# Patient Record
Sex: Female | Born: 1955 | Race: White | Hispanic: No | Marital: Married | Smoking: Former smoker
Health system: Southern US, Community
[De-identification: ages and names within clinical notes are randomized; demographics above are authoritative.]

## PROBLEM LIST (undated history)

## (undated) DIAGNOSIS — R079 Chest pain, unspecified: Secondary | ICD-10-CM

## (undated) DIAGNOSIS — L659 Nonscarring hair loss, unspecified: Secondary | ICD-10-CM

## (undated) DIAGNOSIS — C801 Malignant (primary) neoplasm, unspecified: Secondary | ICD-10-CM

## (undated) DIAGNOSIS — R030 Elevated blood-pressure reading, without diagnosis of hypertension: Secondary | ICD-10-CM

## (undated) DIAGNOSIS — N951 Menopausal and female climacteric states: Secondary | ICD-10-CM

## (undated) DIAGNOSIS — D219 Benign neoplasm of connective and other soft tissue, unspecified: Secondary | ICD-10-CM

## (undated) HISTORY — DX: Benign neoplasm of connective and other soft tissue, unspecified: D21.9

## (undated) HISTORY — DX: Elevated blood-pressure reading, without diagnosis of hypertension: R03.0

## (undated) HISTORY — PX: BREAST SURGERY: SHX581

## (undated) HISTORY — DX: Chest pain, unspecified: R07.9

## (undated) HISTORY — DX: Malignant (primary) neoplasm, unspecified: C80.1

## (undated) HISTORY — PX: TUBAL LIGATION: SHX77

## (undated) HISTORY — PX: COSMETIC SURGERY: SHX468

---
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
## 1997-12-12 ENCOUNTER — Emergency Department (HOSPITAL_COMMUNITY): Admission: EM | Admit: 1997-12-12 | Discharge: 1997-12-12 | Payer: Self-pay

## 1998-10-27 ENCOUNTER — Ambulatory Visit (HOSPITAL_BASED_OUTPATIENT_CLINIC_OR_DEPARTMENT_OTHER): Admission: RE | Admit: 1998-10-27 | Discharge: 1998-10-27 | Payer: Self-pay | Admitting: Plastic Surgery

## 2001-09-30 ENCOUNTER — Encounter: Payer: Self-pay | Admitting: *Deleted

## 2001-09-30 ENCOUNTER — Emergency Department (HOSPITAL_COMMUNITY): Admission: EM | Admit: 2001-09-30 | Discharge: 2001-09-30 | Payer: Self-pay | Admitting: Emergency Medicine

## 2001-09-30 ENCOUNTER — Encounter: Payer: Self-pay | Admitting: Emergency Medicine

## 2005-08-25 ENCOUNTER — Other Ambulatory Visit: Admission: RE | Admit: 2005-08-25 | Discharge: 2005-08-25 | Payer: Self-pay | Admitting: Obstetrics and Gynecology

## 2006-02-28 ENCOUNTER — Encounter: Admission: RE | Admit: 2006-02-28 | Discharge: 2006-02-28 | Payer: Self-pay | Admitting: Obstetrics and Gynecology

## 2008-05-26 ENCOUNTER — Encounter: Payer: Self-pay | Admitting: Obstetrics and Gynecology

## 2008-05-26 ENCOUNTER — Other Ambulatory Visit: Admission: RE | Admit: 2008-05-26 | Discharge: 2008-05-26 | Payer: Self-pay | Admitting: Obstetrics and Gynecology

## 2008-05-26 ENCOUNTER — Ambulatory Visit: Payer: Self-pay | Admitting: Obstetrics and Gynecology

## 2009-11-04 ENCOUNTER — Ambulatory Visit: Payer: Self-pay | Admitting: Obstetrics and Gynecology

## 2009-11-04 ENCOUNTER — Other Ambulatory Visit: Admission: RE | Admit: 2009-11-04 | Discharge: 2009-11-04 | Payer: Self-pay | Admitting: Obstetrics and Gynecology

## 2009-11-24 ENCOUNTER — Ambulatory Visit: Payer: Self-pay | Admitting: Obstetrics and Gynecology

## 2010-02-14 ENCOUNTER — Encounter: Payer: Self-pay | Admitting: Obstetrics and Gynecology

## 2010-11-29 ENCOUNTER — Encounter (INDEPENDENT_AMBULATORY_CARE_PROVIDER_SITE_OTHER): Payer: Self-pay | Admitting: Ophthalmology

## 2010-11-29 DIAGNOSIS — H3530 Unspecified macular degeneration: Secondary | ICD-10-CM

## 2010-11-29 DIAGNOSIS — H43819 Vitreous degeneration, unspecified eye: Secondary | ICD-10-CM

## 2010-11-29 DIAGNOSIS — H348392 Tributary (branch) retinal vein occlusion, unspecified eye, stable: Secondary | ICD-10-CM

## 2011-08-25 ENCOUNTER — Other Ambulatory Visit: Payer: Self-pay | Admitting: Obstetrics and Gynecology

## 2011-08-30 ENCOUNTER — Other Ambulatory Visit: Payer: Self-pay | Admitting: Obstetrics and Gynecology

## 2011-08-30 NOTE — Telephone Encounter (Signed)
As far as I can tell patient has not been here in over one year. Also she needs to be on both estrogen and progesterone. Why was there just refills for estrogen?

## 2011-08-30 NOTE — Telephone Encounter (Signed)
Debarah Crape is going to try to get patient scheduled for CE and then I will check with Dr. Reece Agar regarding refill okay.  Last CE 10/2009 and last office visit 11/2009.

## 2011-08-30 NOTE — Telephone Encounter (Signed)
Rhonda Ray spoke with the patient.  She said she has no insurance and cannot afford appointment.  [name] told her she needs to come for CE to continue getting her prescription. [name] scheduled her for Sept 18 for [facility] but not sure she will keep it.  Her last CE was 11/04/09.  Please advise above regarding refills.  (Hardcopy chart will be in your office for your review.)

## 2011-08-31 ENCOUNTER — Other Ambulatory Visit: Payer: Self-pay | Admitting: Obstetrics and Gynecology

## 2011-08-31 MED ORDER — MEDROXYPROGESTERONE ACETATE 5 MG PO TABS: ORAL_TABLET | ORAL | Status: DC

## 2011-08-31 MED ORDER — ESTRADIOL 0.5 MG PO TABS: 0.5000 mg | ORAL_TABLET | Freq: Every day | ORAL | Status: DC

## 2011-08-31 NOTE — Telephone Encounter (Signed)
I spoke with patient today.  Patient said she has not been on estrogen or progesterone for quite some time. When refills ran out she quit taking it.  Has not been able to afford to come in.  Out of work and no insurance.  Had been fine without it for some time.  Recently began with hotflashes and nightsweats.  I explained the importance of her taking progesterone to protect her uterus and she understood and had always done that before.   She has to see the dentist in August and with that bill cannot afford to come here until September.  Patient states no changes in her health. No new diagnoses.  Has "gained a few pounds" but other than that all is well.  I told her I would talk with you and see if you would want to prescribe for her until she can come in in September.

## 2011-10-03 ENCOUNTER — Encounter: Payer: Self-pay | Admitting: Gynecology

## 2011-10-12 ENCOUNTER — Encounter: Payer: Self-pay | Admitting: Obstetrics and Gynecology

## 2011-10-12 ENCOUNTER — Ambulatory Visit (INDEPENDENT_AMBULATORY_CARE_PROVIDER_SITE_OTHER): Payer: Self-pay | Admitting: Obstetrics and Gynecology

## 2011-10-12 VITALS — BP 118/78 | Ht 60.0 in | Wt 143.0 lb

## 2011-10-12 DIAGNOSIS — Z01419 Encounter for gynecological examination (general) (routine) without abnormal findings: Secondary | ICD-10-CM

## 2011-10-12 MED ORDER — MEDROXYPROGESTERONE ACETATE 5 MG PO TABS: ORAL_TABLET | ORAL | Status: DC

## 2011-10-12 MED ORDER — ESTRADIOL 0.5 MG PO TABS: 0.5000 mg | ORAL_TABLET | Freq: Every day | ORAL | Status: DC

## 2011-10-12 NOTE — Progress Notes (Signed)
Patient came to see me today for her annual GYN exam. When I last saw her in 2011 she had initiated menopause and was symptomatic. We started her on HRT with good results. When she ran out of medication she stopped it. We just called her in  Medication and she already feels better. She does her progesterone cyclically but does not have withdrawal. She is overdue for mammogram. She is currently uninsured. She has never had a bone density. She has never had an abnormal Pap smear. Her last Pap smear was 2011. She is having no vaginal bleeding. She is having no pelvic pain. She has a history of small fibroids.  Physical examination:Rhonda Ray present. HEENT within normal limits. Neck: Thyroid not large. No masses. Supraclavicular nodes: not enlarged. Breasts: Examined in both sitting and lying  position. No skin changes and no masses. Abdomen: Soft no guarding rebound or masses or hernia. Pelvic: External: Within normal limits. BUS: Within normal limits. Vaginal:within normal limits. Good estrogen effect. No evidence of cystocele rectocele or enterocele. Cervix: clean. Uterus: top normal size and shape. Adnexa: No masses. Rectovaginal exam: Confirmatory and negative. Extremities: Within normal limits.  Assessment: Menopausal symptoms. Asymptomatic small fibroids.  Plan: Continue estradiol half a milligram daily. Continue medroxyprogesterone 5 mg day 1 the 12. Information on mammographic scholarships  Given. The new Pap smear guidelines were discussed with the patient. No pap done.

## 2011-10-12 NOTE — Patient Instructions (Signed)
Schedule mammogram.

## 2011-10-13 LAB — URINALYSIS W MICROSCOPIC + REFLEX CULTURE
Bacteria, UA: NONE SEEN
Bilirubin Urine: NEGATIVE
Hgb urine dipstick: NEGATIVE
Ketones, ur: NEGATIVE mg/dL
Protein, ur: NEGATIVE mg/dL
Squamous Epithelial / LPF: NONE SEEN
Urobilinogen, UA: 0.2 mg/dL (ref 0.0–1.0)

## 2011-12-15 ENCOUNTER — Other Ambulatory Visit: Payer: Self-pay

## 2013-11-25 ENCOUNTER — Encounter: Payer: Self-pay | Admitting: Obstetrics and Gynecology

## 2015-09-29 ENCOUNTER — Encounter: Payer: Self-pay | Admitting: Gynecology

## 2015-09-29 ENCOUNTER — Ambulatory Visit (INDEPENDENT_AMBULATORY_CARE_PROVIDER_SITE_OTHER): Payer: Self-pay | Admitting: Gynecology

## 2015-09-29 VITALS — BP 140/88 | Ht 59.75 in | Wt 155.0 lb

## 2015-09-29 DIAGNOSIS — L659 Nonscarring hair loss, unspecified: Secondary | ICD-10-CM

## 2015-09-29 DIAGNOSIS — N951 Menopausal and female climacteric states: Secondary | ICD-10-CM

## 2015-09-29 DIAGNOSIS — Z01419 Encounter for gynecological examination (general) (routine) without abnormal findings: Secondary | ICD-10-CM

## 2015-09-29 HISTORY — DX: Nonscarring hair loss, unspecified: L65.9

## 2015-09-29 HISTORY — DX: Menopausal and female climacteric states: N95.1

## 2015-09-29 MED ORDER — ESTRADIOL 0.5 MG PO TABS: 0.5000 mg | ORAL_TABLET | Freq: Every day | ORAL | 11 refills | Status: DC

## 2015-09-29 MED ORDER — MEDROXYPROGESTERONE ACETATE 10 MG PO TABS: 10.0000 mg | ORAL_TABLET | Freq: Every day | ORAL | 4 refills | Status: DC

## 2015-09-29 NOTE — Patient Instructions (Signed)
Medroxyprogesterone tablets  What is this medicine?  MEDROXYPROGESTERONE (me DROX ee proe JES te rone) is a hormone in a class called progestins. It is commonly used to prevent the uterine lining from overgrowth in women taking an estrogen after menopause. It is also used to treat irregular menstrual bleeding or a lack of menstrual bleeding in women.  This medicine may be used for other purposes; ask your health care provider or pharmacist if you have questions.  What should I tell my health care provider before I take this medicine?  They need to know if you have any of these conditions:  -blood vessel disease or a history of a blood clot in the lungs or legs  -breast, cervical or vaginal cancer  -heart disease  -kidney disease  -liver disease  -migraine  -recent miscarriage or abortion  -mental depression  -migraine  -seizures (convulsions)  -stroke  -vaginal bleeding that has not been evaluated  -an unusual or allergic reaction to medroxyprogesterone, other medicines, foods, dyes, or preservatives  -pregnant or trying to get pregnant  -breast-feeding  How should I use this medicine?  Take this medicine by mouth with a glass of water. Follow the directions on the prescription label. Take your doses at regular intervals. Do not take your medicine more often than directed.  Talk to your pediatrician regarding the use of this medicine in children. Special care may be needed. While this drug may be prescribed for children as young as 13 years for selected conditions, precautions do apply.  Overdosage: If you think you have taken too much of this medicine contact a poison control center or emergency room at once.  NOTE: This medicine is only for you. Do not share this medicine with others.  What if I miss a dose?  If you miss a dose, take it as soon as you can. If it is almost time for your next dose, take only that dose. Do not take double or extra doses.  What may interact with this medicine?  -barbiturate medicines  for inducing sleep or treating seizures (convulsions)  -bosentan  -carbamazepine  -phenytoin  -rifampin  -St. John's Wort  This list may not describe all possible interactions. Give your health care provider a list of all the medicines, herbs, non-prescription drugs, or dietary supplements you use. Also tell them if you smoke, drink alcohol, or use illegal drugs. Some items may interact with your medicine.  What should I watch for while using this medicine?  Visit your health care professional for regular checks on your progress. You will need a regular breast and pelvic exam.  If you have any reason to think you are pregnant, stop taking this medicine at once and contact your doctor or health care professional.  What side effects may I notice from receiving this medicine?  Side effects that you should report to your doctor or health care professional as soon as possible:  -breast tenderness or discharge  -changes in mood or emotions, such as depression  -changes in vision or speech  -pain in the abdomen, chest, groin, or leg  -severe headache  -skin rash, itching, or hives  -sudden shortness of breath  -unusually weak or tired  -yellowing of skin or eyes  Side effects that usually do not require medical attention (report to your doctor or health care professional if they continue or are bothersome):  -acne  -change in menstrual bleeding pattern or flow  -changes in sexual desire  -facial hair growth  -fluid   20 and 25 degrees C (68 and 77 degrees F). Throw away any unused medicine after the expiration date. NOTE: This sheet is a summary. It may not cover all possible information.  If you have questions about this medicine, talk to your doctor, pharmacist, or health care provider.    2016, Elsevier/Gold Standard. (2008-01-10 11:26:12) Estradiol tablets What is this medicine? ESTRADIOL (es tra DYE ole) is an estrogen. It is mostly used as hormone replacement in menopausal women. It helps to treat hot flashes and prevent osteoporosis. It is also used to treat women with low estrogen levels or those who have had their ovaries removed. This medicine may be used for other purposes; ask your health care provider or pharmacist if you have questions. What should I tell my health care provider before I take this medicine? They need to know if you have or ever had any of these conditions: -abnormal vaginal bleeding -blood vessel disease or blood clots -breast, cervical, endometrial, ovarian, liver, or uterine cancer -dementia -diabetes -gallbladder disease -heart disease or recent heart attack -high blood pressure -high cholesterol -high level of calcium in the blood -hysterectomy -kidney disease -liver disease -migraine headaches -protein C deficiency -protein S deficiency -stroke -systemic lupus erythematosus (SLE) -tobacco smoker -an unusual or allergic reaction to estrogens, other hormones, medicines, foods, dyes, or preservatives -pregnant or trying to get pregnant -breast-feeding How should I use this medicine? Take this medicine by mouth. To reduce nausea, this medicine may be taken with food. Follow the directions on the prescription label. Take this medicine at the same time each day and in the order directed on the package. Do not take your medicine more often than directed. Contact your pediatrician regarding the use of this medicine in children. Special care may be needed. A patient package insert for the product will be given with each prescription and refill. Read this sheet carefully each time. The sheet may change frequently. Overdosage: If you think  you have taken too much of this medicine contact a poison control center or emergency room at once. NOTE: This medicine is only for you. Do not share this medicine with others. What if I miss a dose? If you miss a dose, take it as soon as you can. If it is almost time for your next dose, take only that dose. Do not take double or extra doses. What may interact with this medicine? Do not take this medicine with any of the following medications: -aromatase inhibitors like aminoglutethimide, anastrozole, exemestane, letrozole, testolactone This medicine may also interact with the following medications: -carbamazepine -certain antibiotics used to treat infections -certain barbiturates or benzodiazepines used for inducing sleep or treating seizures -grapefruit juice -medicines for fungus infections like itraconazole and ketoconazole -raloxifene or tamoxifen -rifabutin, rifampin, or rifapentine -ritonavir -St. John's Wort -warfarin This list may not describe all possible interactions. Give your health care provider a list of all the medicines, herbs, non-prescription drugs, or dietary supplements you use. Also tell them if you smoke, drink alcohol, or use illegal drugs. Some items may interact with your medicine. What should I watch for while using this medicine? Visit your doctor or health care professional for regular checks on your progress. You will need a regular breast and pelvic exam and Pap smear while on this medicine. You should also discuss the need for regular mammograms with your health care professional, and follow his or her guidelines for these tests. This medicine can make your body retain fluid, making your fingers, hands, or  ankles swell. Your blood pressure can go up. Contact your doctor or health care professional if you feel you are retaining fluid. If you have any reason to think you are pregnant, stop taking this medicine right away and contact your doctor or health care  professional. Smoking increases the risk of getting a blood clot or having a stroke while you are taking this medicine, especially if you are more than 62 years old. You are strongly advised not to smoke. If you wear contact lenses and notice visual changes, or if the lenses begin to feel uncomfortable, consult your eye doctor or health care professional. This medicine can increase the risk of developing a condition (endometrial hyperplasia) that may lead to cancer of the lining of the uterus. Taking progestins, another hormone drug, with this medicine lowers the risk of developing this condition. Therefore, if your uterus has not been removed (by a hysterectomy), your doctor may prescribe a progestin for you to take together with your estrogen. You should know, however, that taking estrogens with progestins may have additional health risks. You should discuss the use of estrogens and progestins with your health care professional to determine the benefits and risks for you. If you are going to have surgery, you may need to stop taking this medicine. Consult your health care professional for advice before you schedule the surgery. What side effects may I notice from receiving this medicine? Side effects that you should report to your doctor or health care professional as soon as possible: -allergic reactions like skin rash, itching or hives, swelling of the face, lips, or tongue -breast tissue changes or discharge -changes in vision -chest pain -confusion, trouble speaking or understanding -dark urine -general ill feeling or flu-like symptoms -light-colored stools -nausea, vomiting -pain, swelling, warmth in the leg -right upper belly pain -severe headaches -shortness of breath -sudden numbness or weakness of the face, arm or leg -trouble walking, dizziness, loss of balance or coordination -unusual vaginal bleeding -yellowing of the eyes or skin Side effects that usually do not require medical  attention (report to your doctor or health care professional if they continue or are bothersome): -hair loss -increased hunger or thirst -increased urination -symptoms of vaginal infection like itching, irritation or unusual discharge -unusually weak or tired This list may not describe all possible side effects. Call your doctor for medical advice about side effects. You may report side effects to FDA at 1-800-FDA-1088. Where should I keep my medicine? Keep out of the reach of children. Store at room temperature between 20 and 25 degrees C (68 and 77 degrees F). Keep container tightly closed. Protect from light. Throw away any unused medicine after the expiration date. NOTE: This sheet is a summary. It may not cover all possible information. If you have questions about this medicine, talk to your doctor, pharmacist, or health care provider.    2016, Elsevier/Gold Standard. (2010-04-14 [id number]) Hormone Therapy At menopause, your body begins making less estrogen and progesterone hormones. This causes the body to stop having menstrual periods. This is because estrogen and progesterone hormones control your periods and menstrual cycle. A lack of estrogen may cause symptoms such as:  Hot flushes (or hot flashes).  Vaginal dryness.  Dry skin.  Loss of sex drive.  Risk of bone loss (osteoporosis). When this happens, you may choose to take hormone therapy to get back the estrogen lost during menopause. When the hormone estrogen is given alone, it is usually referred to as ET (Estrogen Therapy). When  the hormone progestin is combined with estrogen, it is generally called HT (Hormone Therapy). This was formerly known as hormone replacement therapy (HRT). Your caregiver can help you make a decision on what will be best for you. The decision to use HT seems to change often as new studies are done. Many studies do not agree on the benefits of hormone replacement therapy. LIKELY BENEFITS OF HT INCLUDE  PROTECTION FROM:  Hot Flushes (also called hot flashes) - A hot flush is a sudden feeling of heat that spreads over the face and body. The skin may redden like a blush. It is connected with sweats and sleep disturbance. Women going through menopause may have hot flushes a few times a month or several times per day depending on the woman.  Osteoporosis (bone loss) - Estrogen helps guard against bone loss. After menopause, a woman's bones slowly lose calcium and become weak and brittle. As a result, bones are more likely to break. The hip, wrist, and spine are affected most often. Hormone therapy can help slow bone loss after menopause. Weight bearing exercise and taking calcium with vitamin D also can help prevent bone loss. There are also medications that your caregiver can prescribe that can help prevent osteoporosis.  Vaginal dryness - Loss of estrogen causes changes in the vagina. Its lining may become thin and dry. These changes can cause pain and bleeding during sexual intercourse. Dryness can also lead to infections. This can cause burning and itching. (Vaginal estrogen treatment can help relieve pain, itching, and dryness.)  Urinary tract infections are more common after menopause because of lack of estrogen. Some women also develop urinary incontinence because of low estrogen levels in the vagina and bladder.  Possible other benefits of estrogen include a positive effect on mood and short-term memory in women. RISKS AND COMPLICATIONS  Using estrogen alone without progesterone causes the lining of the uterus to grow. This increases the risk of lining of the uterus (endometrial) cancer. Your caregiver should give another hormone called progestin if you have a uterus.  Women who take combined (estrogen and progestin) HT appear to have an increased risk of breast cancer. The risk appears to be small, but increases throughout the time that HT is taken.  Combined therapy also makes the breast  tissue slightly denser which makes it harder to read mammograms (breast X-rays).  Combined, estrogen and progesterone therapy can be taken together every day, in which case there may be spotting of blood. HT therapy can be taken cyclically in which case you will have menstrual periods. Cyclically means HT is taken for a set amount of days, then not taken, then this process is repeated.  HT may increase the risk of stroke, heart attack, breast cancer and forming blood clots in your leg.  Transdermal estrogen (estrogen that is absorbed through the skin with a patch or a cream) may have better results with:  Cholesterol.  Blood pressure.  Blood clots. Having the following conditions may indicate you should not have HT:  Endometrial cancer.  Liver disease.  Breast cancer.  Heart disease.  History of blood clots.  Stroke. TREATMENT   If you choose to take HT and have a uterus, usually estrogen and progestin are prescribed.  Your caregiver will help you decide the best way to take the medications.  Possible ways to take estrogen include:  Pills.  Patches.  Gels.  Sprays.  Vaginal estrogen cream, rings and tablets.  It is best to take the lowest  dose possible that will help your symptoms and take them for the shortest period of time that you can.  Hormone therapy can help relieve some of the problems (symptoms) that affect women at menopause. Before making a decision about HT, talk to your caregiver about what is best for you. Be well informed and comfortable with your decisions. HOME CARE INSTRUCTIONS   Follow your caregivers advice when taking the medications.  A Pap test is done to screen for cervical cancer.  The first Pap test should be done at age 32.  Between ages 61 and 89, Pap tests are repeated every 2 years.  Beginning at age 70, you are advised to have a Pap test every 3 years as long as the past 3 Pap tests have been normal.  Some women have medical  problems that increase the chance of getting cervical cancer. Talk to your caregiver about these problems. It is especially important to talk to your caregiver if a new problem develops soon after your last Pap test. In these cases, your caregiver may recommend more frequent screening and Pap tests.  The above recommendations are the same for women who have or have not gotten the vaccine for HPV (human papillomavirus).  If you had a hysterectomy for a problem that was not a cancer or a condition that could lead to cancer, then you no longer need Pap tests. However, even if you no longer need a Pap test, a regular exam is a good idea to make sure no other problems are starting.  If you are between ages 28 and 58, and you have had normal Pap tests going back 10 years, you no longer need Pap tests. However, even if you no longer need a Pap test, a regular exam is a good idea to make sure no other problems are starting.  If you have had past treatment for cervical cancer or a condition that could lead to cancer, you need Pap tests and screening for cancer for at least 20 years after your treatment.  If Pap tests have been discontinued, risk factors (such as a new sexual partner)need to be re-assessed to determine if screening should be resumed.  Some women may need screenings more often if they are at high risk for cervical cancer.  Get mammograms done as per the advice of your caregiver. SEEK IMMEDIATE MEDICAL CARE IF:  You develop abnormal vaginal bleeding.  You have pain or swelling in your legs, shortness of breath, or chest pain.  You develop dizziness or headaches.  You have lumps or changes in your breasts or armpits.  You have slurred speech.  You develop weakness or numbness of your arms or legs.  You have pain, burning, or bleeding when urinating.  You develop abdominal pain.   This information is not intended to replace advice given to you by your health care provider. Make  sure you discuss any questions you have with your health care provider.   Document Released: 10/09/2002 Document Revised: 05/27/2014 Document Reviewed: 07/14/2014 Elsevier Interactive Patient Education 2016 [place]. Menopause Menopause is the normal time of life when menstrual periods stop completely. Menopause is complete when you have missed 12 consecutive menstrual periods. It usually occurs between the ages of 89 years and 67 years. Very rarely does a woman develop menopause before the age of 104 years. At menopause, your ovaries stop producing the female hormones estrogen and progesterone. This can cause undesirable symptoms and also affect your health. Sometimes the symptoms may  occur 4-5 years before the menopause begins. There is no relationship between menopause and:  Oral contraceptives.  Number of children you had.  Race.  The age your menstrual periods started (menarche). Heavy smokers and very thin women may develop menopause earlier in life. CAUSES  The ovaries stop producing the female hormones estrogen and progesterone.  Other causes include:  Surgery to remove both ovaries.  The ovaries stop functioning for no known reason.  Tumors of the pituitary gland in the brain.  Medical disease that affects the ovaries and hormone production.  Radiation treatment to the abdomen or pelvis.  Chemotherapy that affects the ovaries. SYMPTOMS   Hot flashes.  Night sweats.  Decrease in sex drive.  Vaginal dryness and thinning of the vagina causing painful intercourse.  Dryness of the skin and developing wrinkles.  Headaches.  Tiredness.  Irritability.  Memory problems.  Weight gain.  Bladder infections.  Hair growth of the face and chest.  Infertility. More serious symptoms include:  Loss of bone (osteoporosis) causing breaks (fractures).  Depression.  Hardening and narrowing of the arteries (atherosclerosis) causing heart attacks and  strokes. DIAGNOSIS   When the menstrual periods have stopped for 12 straight months.  Physical exam.  Hormone studies of the blood. TREATMENT  There are many treatment choices and nearly as many questions about them. The decisions to treat or not to treat menopausal changes is an individual choice made with your health care provider. Your health care provider can discuss the treatments with you. Together, you can decide which treatment will work best for you. Your treatment choices may include:   Hormone therapy (estrogen and progesterone).  Non-hormonal medicines.  Treating the individual symptoms with medicine (for example antidepressants for depression).  Herbal medicines that may help specific symptoms.  Counseling by a psychiatrist or psychologist.  Group therapy.  Lifestyle changes including:  Eating healthy.  Regular exercise.  Limiting caffeine and alcohol.  Stress management and meditation.  No treatment. HOME CARE INSTRUCTIONS   Take the medicine your health care provider gives you as directed.  Get plenty of sleep and rest.  Exercise regularly.  Eat a diet that contains calcium (good for the bones) and soy products (acts like estrogen hormone).  Avoid alcoholic beverages.  Do not smoke.  If you have hot flashes, dress in layers.  Take supplements, calcium, and vitamin D to strengthen bones.  You can use over-the-counter lubricants or moisturizers for vaginal dryness.  Group therapy is sometimes very helpful.  Acupuncture may be helpful in some cases. SEEK MEDICAL CARE IF:   You are not sure you are in menopause.  You are having menopausal symptoms and need advice and treatment.  You are still having menstrual periods after age 13 years.  You have pain with intercourse.  Menopause is complete (no menstrual period for 12 months) and you develop vaginal bleeding.  You need a referral to a specialist (gynecologist, psychiatrist, or  psychologist) for treatment. SEEK IMMEDIATE MEDICAL CARE IF:   You have severe depression.  You have excessive vaginal bleeding.  You fell and think you have a broken bone.  You have pain when you urinate.  You develop leg or chest pain.  You have a fast pounding heart beat (palpitations).  You have severe headaches.  You develop vision problems.  You feel a lump in your breast.  You have abdominal pain or severe indigestion.   This information is not intended to replace advice given to you by your health  care provider. Make sure you discuss any questions you have with your health care provider.   Document Released: 04/02/2003 Document Revised: 09/12/2012 Document Reviewed: 08/09/2012 Elsevier Interactive Patient Education [employer].

## 2015-09-29 NOTE — Progress Notes (Signed)
Rhonda Ray [birth year] [id number]   History:    60 y.o. who has not been seen in the office in 2011 was complaining of hot flashes, irritability, mood swing, insomnia and vaginal dryness. She has been menopausal several years has never been on hormone replacement therapy. Review of her record indicated the following:  Past history of fibroid uterus Cesarean section 1970 04/12/1974 Breast reduction the year 2000 Past history of tubal ligation  Patient with no past history of any abnormal Pap smear No past history of colonoscopy Last mammogram in 2008  PCP at [facility] is been doing her blood work she scheduled to see him tomorrow because of several days of vague chest discomfort.   Past medical history,surgical history, family history and social history were all reviewed and documented in the EPIC chart.  Gynecologic History No LMP recorded. Patient is postmenopausal. Contraception: post menopausal status Last Pap 2011. Results were: normal Last mammogram: 2008. Results were: normal  Obstetric History OB History  Gravida Para Term Preterm AB Living  2 2 2     2   SAB TAB Ectopic Multiple Live Births               # Outcome Date GA Lbr Len/2nd Weight Sex Delivery Anes PTL Lv  2 Term           1 Term                ROS: A ROS was performed and pertinent positives and negatives are included in the history.  GENERAL: No fevers or chills. HEENT: No change in vision, no earache, sore throat or sinus congestion. NECK: No pain or stiffness. CARDIOVASCULAR: No chest pain or pressure. No palpitations. PULMONARY: No shortness of breath, cough or wheeze. GASTROINTESTINAL: No abdominal pain, nausea, vomiting or diarrhea, melena or bright red blood per rectum. GENITOURINARY: No urinary frequency, urgency, hesitancy or dysuria. MUSCULOSKELETAL: No joint or muscle pain, no back pain, no recent trauma. DERMATOLOGIC: No rash, no itching, no lesions. ENDOCRINE: No polyuria,  polydipsia, no heat or cold intolerance. No recent change in weight. HEMATOLOGICAL: No anemia or easy bruising or bleeding. NEUROLOGIC: No headache, seizures, numbness, tingling or weakness. PSYCHIATRIC: No depression, no loss of interest in normal activity or change in sleep pattern.     Exam: chaperone present  BP 140/88   Ht 4' 11.75" (1.518 m)   Wt 155 lb (70.3 kg)   BMI 30.53 kg/m   Body mass index is 30.53 kg/m.  General appearance : Well developed well nourished female. No acute distress HEENT: Eyes: no retinal hemorrhage or exudates,  Neck supple, trachea midline, no carotid bruits, no thyroidmegaly Lungs: Clear to auscultation, no rhonchi or wheezes, or rib retractions  Heart: Regular rate and rhythm, no murmurs or gallops Breast:Examined in sitting and supine position were symmetrical in appearance, no palpable masses or tenderness,  no skin retraction, no nipple inversion, no nipple discharge, no skin discoloration, no axillary or supraclavicular lymphadenopathy Abdomen: no palpable masses or tenderness, no rebound or guarding Extremities: no edema or skin discoloration or tenderness  Pelvic:  Bartholin, Urethra, Skene Glands: Within normal limits             Vagina: No gross lesions or discharge, atrophic changes  Cervix: No gross lesions or discharge  Uterus  anteverted, normal size, shape and consistency, non-tender and mobile  Adnexa  Without masses or tenderness  Anus and perineum  normal   Rectovaginal  normal sphincter  tone without palpated masses or tenderness             Hemoccult recommend colonoscopy this year     Assessment/Plan:  61 y.o. female for annual exam who has never had a baseline colonoscopy will be given the names of community gastroenterologist to schedule. She was also provided with a requisition to schedule her overdue mammogram. We discussed importance of monthly breast exams. We discussed importance of calcium vitamin D and weightbearing  exercises for osteoporosis prevention. Next year she will need a bone density study. We discussed based on her symptoms of the menopause the risks benefits and pros and cons of hormone replacement therapy. We also discussed women's health initiative study. Patient is ready to start medication. Literature information was provided on all the above mentioned subject. She will be started on Estrace 0.5 mg daily with the addition of Provera 10 mg for 10 days of the month. Pap smear was done today. PCP doing her blood work.   Terrance Mass MD, 11:27 AM 09/29/2015

## 2015-09-30 LAB — PAP IG W/ RFLX HPV ASCU

## 2015-10-01 ENCOUNTER — Ambulatory Visit
Admission: RE | Admit: 2015-10-01 | Discharge: 2015-10-01 | Disposition: A | Discharge status: Home / Self Care | Payer: No Typology Code available for payment source | Source: Ambulatory Visit | Attending: Family Medicine | Admitting: Family Medicine

## 2015-10-01 ENCOUNTER — Other Ambulatory Visit: Payer: Self-pay | Admitting: Family Medicine

## 2015-10-01 DIAGNOSIS — R0789 Other chest pain: Secondary | ICD-10-CM

## 2015-11-16 ENCOUNTER — Telehealth: Payer: Self-pay | Admitting: *Deleted

## 2015-11-16 NOTE — Telephone Encounter (Signed)
(  pt aware you are out of the office) Pt takes estradiol 0.5 mg daily and provera 5 mg daily. Pt said she understood spotting/bleeding could occur, but on 11/11/15 she had heavy bleeding that lasted 3 days changing pad every 1 hours, bleeding now is light. Pt thought she would let you know. Please advise

## 2015-11-16 NOTE — Telephone Encounter (Signed)
My instructions had been as followed:" She will be started on Estrace 0.5 mg daily with the addition of Provera 10 mg for 10 days of the month". She has not been taking it correctly. She will need to come to office for possible biopsy.

## 2015-11-17 NOTE — Telephone Encounter (Signed)
Left message for pt to call.

## 2015-11-17 NOTE — Telephone Encounter (Signed)
Pt informed with the below, transferred to front desk. 

## 2015-11-17 NOTE — Telephone Encounter (Signed)
Spoke with patient and she is taking the medication correctly provera 10 mg day 1-10 every month. She didn't have her bottles with her yesterday and apologized that she told me in correctly.  Does she still need to come in for endometrial biopsy? Please advise

## 2015-11-17 NOTE — Telephone Encounter (Signed)
Yes please have her come in this week and make sure she is taking medicine correctly.

## 2015-11-26 ENCOUNTER — Encounter: Payer: Self-pay | Admitting: Gynecology

## 2015-11-26 ENCOUNTER — Ambulatory Visit (INDEPENDENT_AMBULATORY_CARE_PROVIDER_SITE_OTHER): Payer: Self-pay | Admitting: Gynecology

## 2015-11-26 VITALS — BP 134/86

## 2015-11-26 DIAGNOSIS — N950 Postmenopausal bleeding: Secondary | ICD-10-CM

## 2015-11-26 DIAGNOSIS — Z79890 Hormone replacement therapy (postmenopausal): Secondary | ICD-10-CM

## 2015-11-26 NOTE — Progress Notes (Signed)
   Patient is a 60 year old who was seen in the office in September 2017 prior to that it had been 2011. Patient was suffering with vasomotor symptoms and had been started on Estrace 0.5 mg daily with the addition of Provera 10 mg for 10 days of the month. She had called the office because of second a prescription she begin taking the Provera she began bleeding heavily for 3-4 days and is here for follow-up. She didn't start the month of October Provera until today and is otherwise been doing well. Since she did not have any 4 menstrual bleeding and several years we decided proceed with an endometrial biopsy before continuing this hormone replacement therapy regimen.  The patient was counseled for endometrial biopsy. Pelvic exam: Bartholin urethra Skene was within normal limits Vagina: No lesions or discharge Cervix: No lesions or discharge Uterus: Anteverted normal size shape and consistency Adnexa: No palpable mass or tenderness Rectal exam not done  The cervix was cleansed with Betadine solution. A CO2 tenaculum was placed on the anterior cervical lip. A sterile Pipelle was introduced into the uterine cavity and tissuesubmitted for histological evaluation. The single-tooth tenaculum was removed. Patient tolerated procedure well.  Patient declined flu vaccine.  Assessment/plan: Postmenopausal patient recently start hormone replacement therapy at first episode of bleeding during time of progestational agent and endometrial biopsy was done today and if benign she'll continue with the above regimen with reassurance. Of note her recent Pap smear was normal. She was reminded to schedule her overdue mammogram and colonoscopy.

## 2015-11-26 NOTE — Patient Instructions (Signed)

## 2016-06-08 ENCOUNTER — Encounter: Payer: Self-pay | Admitting: Gynecology

## 2016-11-10 ENCOUNTER — Other Ambulatory Visit: Payer: Self-pay | Admitting: *Deleted

## 2016-11-10 MED ORDER — ESTRADIOL 0.5 MG PO TABS: 0.5000 mg | ORAL_TABLET | Freq: Every day | ORAL | 1 refills | Status: AC

## 2016-11-10 MED ORDER — MEDROXYPROGESTERONE ACETATE 10 MG PO TABS: ORAL_TABLET | ORAL | 0 refills | Status: AC

## 2016-12-30 ENCOUNTER — Other Ambulatory Visit: Payer: Self-pay | Admitting: Family Medicine

## 2016-12-30 ENCOUNTER — Ambulatory Visit
Admission: RE | Admit: 2016-12-30 | Discharge: 2016-12-30 | Disposition: A | Discharge status: Home / Self Care | Payer: No Typology Code available for payment source | Source: Ambulatory Visit | Attending: Family Medicine | Admitting: Family Medicine

## 2016-12-30 DIAGNOSIS — R059 Cough, unspecified: Secondary | ICD-10-CM

## 2016-12-30 DIAGNOSIS — R05 Cough: Secondary | ICD-10-CM

## 2017-01-27 ENCOUNTER — Other Ambulatory Visit: Payer: Self-pay | Admitting: Obstetrics & Gynecology

## 2017-08-16 ENCOUNTER — Encounter: Payer: Self-pay | Admitting: Gynecology

## 2017-08-16 ENCOUNTER — Ambulatory Visit (INDEPENDENT_AMBULATORY_CARE_PROVIDER_SITE_OTHER): Payer: Self-pay | Admitting: Gynecology

## 2017-08-16 VITALS — BP 134/84 | Ht 60.0 in | Wt 156.0 lb

## 2017-08-16 DIAGNOSIS — N952 Postmenopausal atrophic vaginitis: Secondary | ICD-10-CM

## 2017-08-16 DIAGNOSIS — Z01419 Encounter for gynecological examination (general) (routine) without abnormal findings: Secondary | ICD-10-CM

## 2017-08-16 NOTE — Progress Notes (Signed)
Rhonda Ray 08-Aug-1955 [id number]        62 y.o.  G2P2002 for annual gynecologic exam.  Former patient of Dr. Toney Rakes.  Several issues noted below.  Past medical history,surgical history, problem list, medications, allergies, family history and social history were all reviewed and documented as reviewed in the EPIC chart.  ROS:  Performed with pertinent positives and negatives included in the history, assessment and plan.   Additional significant findings : None   Exam: Caryn Bee assistant Vitals:   08/16/17 1158  BP: 134/84  Weight: 156 lb (70.8 kg)  Height: 5' (1.524 m)   Body mass index is 30.47 kg/m.  General appearance:  Normal affect, orientation and appearance. Skin: Grossly normal HEENT: Without gross lesions.  No cervical or supraclavicular adenopathy. Thyroid normal.  Lungs:  Clear without wheezing, rales or rhonchi Cardiac: RR, without RMG Abdominal:  Soft, nontender, without masses, guarding, rebound, organomegaly or hernia Breasts:  Examined lying and sitting without masses, retractions, discharge or axillary adenopathy. Pelvic:  Ext, BUS, Vagina: With atrophic changes  Cervix: With atrophic changes  Uterus: Difficult to palpate.  No gross masses or tenderness   Adnexa: Without masses or tenderness    Anus and perineum: Normal   Rectovaginal: Normal sphincter tone without palpated masses or tenderness.    Assessment/Plan:  62 y.o. G80P2002 female for annual gynecologic exam.   1. Postmenopausal/atrophic genital changes.  Had been on HRT in the past but discontinued.  No history of bleeding.  Some hot flushes and sweats.  Reviewed risks versus benefits of HRT to include stroke heart attack DVT in the breast cancer issue.  At this point at age 49 my recommendations would be to stay off of HRT at this time.  As her symptoms do seem to be getting better overall than to follow at this point.  Report any vaginal bleeding. 2. Mammography 2008.  Discussed with  patient she is way overdue and recommended baseline mammography.  Names and numbers provided.  Breast exam normal today. 3. Colonoscopy never.  Second most common cancer in women discussed.  Strongly recommended baseline colonoscopy.  Names and numbers provided. 4. DEXA never.  Will plan DEXA next year at age 73 as baseline. 5. Pap smear 2017.  No Pap smear done today.  No history of abnormal Pap smears previously.  Plan repeat Pap smear next year at 3-year interval per current screening guidelines. 6. Health maintenance.  Patient is complaining of fatigue.  I recommended that she follow-up with her primary physician to be evaluated.  I also recommended she check with him to make sure she is having her routine blood work done.  She relates having seen him several times this year and will follow-up with him in reference to the above.  She initially discussed with my medical assistant her alcohol intake and her struggles.  Relates a 6 pack/day with additional wine at times.  We discussed her concerns and she relates having an issue with this.  She has a family history of alcoholism.  At times has given up for a week or 2 but then started back again.  We discussed the issues of alcohol excess to include psychological, social and health issues.  I reviewed various resources available to her within the community as well as within [facility] health.  I offered to direct her towards these resources but at this point she declines and is not ready to commit.  I asked her to call me if she  changes her mind at any time and would like a referral.  Follow-up in 1 year for annual gynecologic exam, sooner if any issues.   Anastasio Auerbach MD, 12:36 PM 08/16/2017

## 2017-08-16 NOTE — Patient Instructions (Addendum)
Call my office if you want me to refer you to resources to help address alcohol excess  Schedule your colonoscopy with either:  [facility]   Address: [address], [place], [place] [address]  [phone]    or  [facility]  Address: [address], [place], [place] [address]  [phone]      Call to Schedule your mammogram  Facilities in [place]: 1)  [place] [place] Phone: [phone]  Dr. Isaiah Blakes at [facility] [address] Phone: [phone]     Mammogram A mammogram is an X-ray test to find changes in a woman's breast. You should get a mammogram if:  You are 24 years of age or older  You have risk factors.   Your doctor recommends that you have one.  BEFORE THE TEST  Do not schedule the test the week before your period, especially if your breasts are sore during this time.  On the day of your mammogram:  Wash your breasts and armpits well. After washing, do not put on any deodorant or talcum powder on until after your test.   Eat and drink as you usually do.   Take your medicines as usual.   If you are diabetic and take insulin, make sure you:   Eat before coming for your test.   Take your insulin as usual.   If you cannot keep your appointment, call before the appointment to cancel. Schedule another appointment.  TEST  You will need to undress from the waist up. You will put on a hospital gown.   Your breast will be put on the mammogram machine, and it will press firmly on your breast with a piece of plastic called a compression paddle. This will make your breast flatter so that the machine can X-ray all parts of your breast.   Both breasts will be X-rayed. Each breast will be X-rayed from above and from the side. An X-ray might need to be taken again if the picture is not good enough.   The mammogram will last  about 15 to 30 minutes.  AFTER THE TEST Finding out the results of your test Ask when your test results will be ready. Make sure you get your test results.  Document Released: 04/08/2008 Document Revised: 12/30/2010 Document Reviewed: 04/08/2008 [facility] Patient Information 2012 [place].

## 2018-07-09 ENCOUNTER — Other Ambulatory Visit: Payer: Self-pay

## 2018-07-11 ENCOUNTER — Ambulatory Visit (INDEPENDENT_AMBULATORY_CARE_PROVIDER_SITE_OTHER): Payer: Self-pay | Admitting: Women's Health

## 2018-07-11 ENCOUNTER — Encounter: Payer: Self-pay | Admitting: Women's Health

## 2018-07-11 ENCOUNTER — Telehealth: Payer: Self-pay | Admitting: *Deleted

## 2018-07-11 ENCOUNTER — Other Ambulatory Visit: Payer: Self-pay

## 2018-07-11 VITALS — BP 122/80

## 2018-07-11 DIAGNOSIS — N644 Mastodynia: Secondary | ICD-10-CM

## 2018-07-11 NOTE — Telephone Encounter (Signed)
-----   Message from Huel Cote, NP sent at 07/11/2018 11:45 AM EDT ----- Needs diagnostic  rt mammogram/US  has  pain inside breast like a muscle pull, no injury, no palpable masses, dimpling of changes in appearance, no nipple discharge.  No family hx of  breast cancer.  Last mammog 2008. Marland Kitchen  Has no insurance.  Anyday but Mondays or week of July 6

## 2018-07-11 NOTE — Telephone Encounter (Signed)
Sent staff to [place] at [employer] they will call to schedule and will help due to being uninsured. I called patient to relay and left detailed on cell per DPR access.

## 2018-07-11 NOTE — Progress Notes (Signed)
63 year old MWF G2, P2 presents with complaint of right upper breast nagging pain, questionable right breast muscle pull. States has had an intermittent pain inside the breast for the past 3 to 4 months, increases with movement.  Denies any injury, change in routine, nipple discharge or change in breast exam.  Last mammogram 2008.  No known breast cancer family history.  Postmenopausal on no HRT with no bleeding.  Currently without insurance.  Exam: Appears well slightly worried.  Breast examined sitting and lying position without visible dimpling, erythema, nipple discharge, palpable nodules or masses.  History of bilateral breast reduction.  Right breast tenderness  Plan: Right breast diagnostic mammogram with ultrasound, will get scheduled.  Reviewed importance of annual screening mammogram, scholarship mammogram program if without insurance in the future.  Decrease caffeine, continue to wear supportive bra.

## 2018-07-13 ENCOUNTER — Telehealth (HOSPITAL_COMMUNITY): Payer: Self-pay

## 2018-07-13 NOTE — Telephone Encounter (Signed)
Patient called back and said [facility] told her she doesn't qualify with this service. Patient asked that I put the orders and she will just pay the cost and discuss with breast center. Orders placed, patient will call to schedule.

## 2018-07-13 NOTE — Telephone Encounter (Signed)
Telephoned patient at home left message, return call to [facility]. Left telephone number and name to return call.

## 2018-07-16 ENCOUNTER — Ambulatory Visit: Payer: No Typology Code available for payment source

## 2018-07-16 ENCOUNTER — Other Ambulatory Visit: Payer: Self-pay

## 2018-07-16 ENCOUNTER — Ambulatory Visit
Admission: RE | Admit: 2018-07-16 | Discharge: 2018-07-16 | Disposition: A | Discharge status: Home / Self Care | Payer: No Typology Code available for payment source | Source: Ambulatory Visit | Attending: Women's Health | Admitting: Women's Health

## 2018-07-16 DIAGNOSIS — N644 Mastodynia: Secondary | ICD-10-CM

## 2018-07-16 NOTE — Telephone Encounter (Signed)
Patient scheduled on 07/16/18

## 2018-08-03 DIAGNOSIS — R079 Chest pain, unspecified: Secondary | ICD-10-CM | POA: Diagnosis not present

## 2018-08-03 DIAGNOSIS — R0789 Other chest pain: Secondary | ICD-10-CM | POA: Diagnosis not present

## 2018-08-03 DIAGNOSIS — R7989 Other specified abnormal findings of blood chemistry: Secondary | ICD-10-CM | POA: Diagnosis not present

## 2018-08-10 ENCOUNTER — Other Ambulatory Visit: Payer: Self-pay | Admitting: Family Medicine

## 2018-08-10 DIAGNOSIS — R079 Chest pain, unspecified: Secondary | ICD-10-CM

## 2018-08-14 ENCOUNTER — Ambulatory Visit
Admission: RE | Admit: 2018-08-14 | Discharge: 2018-08-14 | Disposition: A | Discharge status: Home / Self Care | Payer: PPO | Source: Ambulatory Visit | Attending: Family Medicine | Admitting: Family Medicine

## 2018-08-14 ENCOUNTER — Other Ambulatory Visit: Payer: Self-pay

## 2018-08-14 DIAGNOSIS — R0789 Other chest pain: Secondary | ICD-10-CM | POA: Diagnosis not present

## 2018-08-14 DIAGNOSIS — R079 Chest pain, unspecified: Secondary | ICD-10-CM

## 2018-08-14 MED ORDER — IOPAMIDOL (ISOVUE-300) INJECTION 61%
75.0000 mL | Freq: Once | INTRAVENOUS | Status: AC | PRN
  Administered 2018-08-14: 08:00:00 75 mL via INTRAVENOUS

## 2018-10-18 DIAGNOSIS — L821 Other seborrheic keratosis: Secondary | ICD-10-CM | POA: Diagnosis not present

## 2018-10-18 DIAGNOSIS — D229 Melanocytic nevi, unspecified: Secondary | ICD-10-CM | POA: Diagnosis not present

## 2018-10-18 DIAGNOSIS — L814 Other melanin hyperpigmentation: Secondary | ICD-10-CM | POA: Diagnosis not present

## 2018-10-18 DIAGNOSIS — L820 Inflamed seborrheic keratosis: Secondary | ICD-10-CM | POA: Diagnosis not present

## 2018-10-18 DIAGNOSIS — L570 Actinic keratosis: Secondary | ICD-10-CM | POA: Diagnosis not present

## 2018-10-18 DIAGNOSIS — L718 Other rosacea: Secondary | ICD-10-CM | POA: Diagnosis not present

## 2018-10-25 ENCOUNTER — Ambulatory Visit: Payer: PPO | Admitting: Cardiology

## 2018-10-31 ENCOUNTER — Encounter: Payer: Self-pay | Admitting: Gynecology

## 2018-11-22 ENCOUNTER — Encounter: Payer: Self-pay | Admitting: Cardiology

## 2018-11-22 ENCOUNTER — Other Ambulatory Visit: Payer: Self-pay

## 2018-11-22 ENCOUNTER — Ambulatory Visit: Payer: PPO | Admitting: Cardiology

## 2018-11-22 ENCOUNTER — Encounter

## 2018-11-22 VITALS — BP 140/90 | HR 63 | Ht 60.0 in | Wt 165.8 lb

## 2018-11-22 DIAGNOSIS — Z79899 Other long term (current) drug therapy: Secondary | ICD-10-CM

## 2018-11-22 DIAGNOSIS — R0789 Other chest pain: Secondary | ICD-10-CM

## 2018-11-22 DIAGNOSIS — I2584 Coronary atherosclerosis due to calcified coronary lesion: Secondary | ICD-10-CM | POA: Diagnosis not present

## 2018-11-22 DIAGNOSIS — I2510 Atherosclerotic heart disease of native coronary artery without angina pectoris: Secondary | ICD-10-CM

## 2018-11-22 MED ORDER — ASPIRIN EC 81 MG PO TBEC: 81.0000 mg | DELAYED_RELEASE_TABLET | Freq: Every day | ORAL | 3 refills | Status: AC

## 2018-11-22 MED ORDER — ROSUVASTATIN CALCIUM 10 MG PO TABS: 10.0000 mg | ORAL_TABLET | Freq: Every day | ORAL | 11 refills | Status: AC
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
## 2018-11-22 NOTE — Patient Instructions (Signed)
Medication Instructions:  Please start Aspirin 81 mg a day. Start Crestor 20 mg daily. Continue all other medications as listed  *If you need a refill on your cardiac medications before your next appointment, please call your pharmacy*  Lab Work: Please have blood work in 3 months (Lipid/ALT) If you have labs (blood work) drawn today and your tests are completely normal, you will receive your results only by: Marland Kitchen MyChart Message (if you have MyChart) OR . A paper copy in the mail If you have any lab test that is abnormal or we need to change your treatment, we will call you to review the results.  Follow-Up: Follow up as needed with Dr Marlou Porch.   Thank you for choosing [facility]!!

## 2019-02-27 ENCOUNTER — Other Ambulatory Visit: Payer: PPO

## 2019-07-08 DIAGNOSIS — L309 Dermatitis, unspecified: Secondary | ICD-10-CM | POA: Diagnosis not present

## 2019-07-08 DIAGNOSIS — K219 Gastro-esophageal reflux disease without esophagitis: Secondary | ICD-10-CM | POA: Diagnosis not present

## 2019-08-30 DIAGNOSIS — L718 Other rosacea: Secondary | ICD-10-CM | POA: Diagnosis not present

## 2019-08-30 DIAGNOSIS — L578 Other skin changes due to chronic exposure to nonionizing radiation: Secondary | ICD-10-CM | POA: Diagnosis not present

## 2019-10-11 DIAGNOSIS — L820 Inflamed seborrheic keratosis: Secondary | ICD-10-CM | POA: Diagnosis not present

## 2019-10-11 DIAGNOSIS — L718 Other rosacea: Secondary | ICD-10-CM | POA: Diagnosis not present

## 2019-10-11 DIAGNOSIS — L570 Actinic keratosis: Secondary | ICD-10-CM | POA: Diagnosis not present

## 2019-11-07 DIAGNOSIS — M25372 Other instability, left ankle: Secondary | ICD-10-CM | POA: Diagnosis not present

## 2019-11-07 DIAGNOSIS — Z1211 Encounter for screening for malignant neoplasm of colon: Secondary | ICD-10-CM | POA: Diagnosis not present

## 2019-11-07 DIAGNOSIS — M7989 Other specified soft tissue disorders: Secondary | ICD-10-CM | POA: Diagnosis not present

## 2019-11-14 DIAGNOSIS — Z1211 Encounter for screening for malignant neoplasm of colon: Secondary | ICD-10-CM | POA: Diagnosis not present

## 2019-11-27 ENCOUNTER — Other Ambulatory Visit: Payer: Self-pay

## 2019-11-27 ENCOUNTER — Encounter: Payer: Self-pay | Admitting: Podiatry

## 2019-11-27 ENCOUNTER — Ambulatory Visit: Payer: PPO | Admitting: Podiatry

## 2019-11-27 ENCOUNTER — Ambulatory Visit (INDEPENDENT_AMBULATORY_CARE_PROVIDER_SITE_OTHER): Payer: PPO

## 2019-11-27 DIAGNOSIS — M722 Plantar fascial fibromatosis: Secondary | ICD-10-CM

## 2019-11-27 DIAGNOSIS — M7751 Other enthesopathy of right foot: Secondary | ICD-10-CM | POA: Diagnosis not present

## 2019-11-27 DIAGNOSIS — M79672 Pain in left foot: Secondary | ICD-10-CM | POA: Diagnosis not present

## 2019-11-27 DIAGNOSIS — S93492A Sprain of other ligament of left ankle, initial encounter: Secondary | ICD-10-CM

## 2019-11-27 DIAGNOSIS — M79671 Pain in right foot: Secondary | ICD-10-CM

## 2019-11-27 NOTE — Progress Notes (Signed)
Subjective:   Patient ID: Rhonda Ray, female   DOB: 64 y.o.   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  Patient does not smoke likes to be active   Review of Systems  All other systems reviewed and are negative.       Objective:  Physical Exam Vitals and nursing note reviewed.  Constitutional:      Appearance: She is well-developed.  Pulmonary:     Effort: Pulmonary effort is normal.  Musculoskeletal:        General: Normal range of motion.  Skin:    General: Skin is warm.  Neurological:     Mental Status: She is alert.     Neurovascular status intact muscle strength was found to be adequate range of motion adequate.  Patient does have a 7 x 7 mm nodule in the plantar arch and also appears to have inflammation of the fascial band itself and on the left I did not note excessive inversion but she does have history of instability.  Good digital perfusion well oriented x3     Assessment:  Possibility for plantar fibroma left versus fasciitis or cyst condition and ankle instability left     Plan:  H&P reviewed all conditions today I did sterile prep and injected the mid arch area right 3 mg Dexasone Kenalog 5 mg Xylocaine and for the left I discussed bracing or possible ligamentous repair and will get a hold off on ligamentous repair even though 1 point future may be necessary.  Patient will be seen back for [place] to recheck  X-rays were negative for signs of fracture or diastases injury or long-term arthritis left ankle and no signs of calcification right

## 2019-12-10 ENCOUNTER — Other Ambulatory Visit: Payer: Self-pay | Admitting: Podiatry

## 2019-12-10 DIAGNOSIS — M7751 Other enthesopathy of right foot: Secondary | ICD-10-CM

## 2019-12-10 DIAGNOSIS — S93492A Sprain of other ligament of left ankle, initial encounter: Secondary | ICD-10-CM

## 2020-01-31 DIAGNOSIS — L820 Inflamed seborrheic keratosis: Secondary | ICD-10-CM | POA: Diagnosis not present

## 2020-01-31 DIAGNOSIS — L570 Actinic keratosis: Secondary | ICD-10-CM | POA: Diagnosis not present

## 2021-06-01 DIAGNOSIS — L820 Inflamed seborrheic keratosis: Secondary | ICD-10-CM | POA: Diagnosis not present

## 2021-06-01 DIAGNOSIS — C44319 Basal cell carcinoma of skin of other parts of face: Secondary | ICD-10-CM | POA: Diagnosis not present

## 2021-06-01 DIAGNOSIS — D485 Neoplasm of uncertain behavior of skin: Secondary | ICD-10-CM | POA: Diagnosis not present

## 2021-06-01 DIAGNOSIS — Z85828 Personal history of other malignant neoplasm of skin: Secondary | ICD-10-CM | POA: Diagnosis not present

## 2021-06-01 DIAGNOSIS — L821 Other seborrheic keratosis: Secondary | ICD-10-CM | POA: Diagnosis not present

## 2021-06-01 DIAGNOSIS — D225 Melanocytic nevi of trunk: Secondary | ICD-10-CM | POA: Diagnosis not present

## 2021-06-01 DIAGNOSIS — C44329 Squamous cell carcinoma of skin of other parts of face: Secondary | ICD-10-CM | POA: Diagnosis not present

## 2021-06-01 DIAGNOSIS — L814 Other melanin hyperpigmentation: Secondary | ICD-10-CM | POA: Diagnosis not present

## 2021-06-01 DIAGNOSIS — Z872 Personal history of diseases of the skin and subcutaneous tissue: Secondary | ICD-10-CM | POA: Diagnosis not present

## 2021-06-01 DIAGNOSIS — L538 Other specified erythematous conditions: Secondary | ICD-10-CM | POA: Diagnosis not present

## 2021-06-01 DIAGNOSIS — D224 Melanocytic nevi of scalp and neck: Secondary | ICD-10-CM | POA: Diagnosis not present

## 2021-06-01 DIAGNOSIS — L570 Actinic keratosis: Secondary | ICD-10-CM | POA: Diagnosis not present

## 2021-06-01 DIAGNOSIS — Z08 Encounter for follow-up examination after completed treatment for malignant neoplasm: Secondary | ICD-10-CM | POA: Diagnosis not present
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
## 2021-08-24 DIAGNOSIS — C44311 Basal cell carcinoma of skin of nose: Secondary | ICD-10-CM | POA: Diagnosis not present

## 2021-09-01 DIAGNOSIS — L820 Inflamed seborrheic keratosis: Secondary | ICD-10-CM | POA: Diagnosis not present

## 2021-09-01 DIAGNOSIS — D485 Neoplasm of uncertain behavior of skin: Secondary | ICD-10-CM | POA: Diagnosis not present

## 2021-09-01 DIAGNOSIS — L570 Actinic keratosis: Secondary | ICD-10-CM | POA: Diagnosis not present

## 2021-09-01 DIAGNOSIS — D0439 Carcinoma in situ of skin of other parts of face: Secondary | ICD-10-CM | POA: Diagnosis not present

## 2021-09-01 DIAGNOSIS — L538 Other specified erythematous conditions: Secondary | ICD-10-CM | POA: Diagnosis not present

## 2021-09-01 DIAGNOSIS — C44319 Basal cell carcinoma of skin of other parts of face: Secondary | ICD-10-CM | POA: Diagnosis not present

## 2021-09-07 DIAGNOSIS — D0439 Carcinoma in situ of skin of other parts of face: Secondary | ICD-10-CM | POA: Diagnosis not present

## 2022-03-18 DIAGNOSIS — R5383 Other fatigue: Secondary | ICD-10-CM | POA: Diagnosis not present

## 2022-03-18 DIAGNOSIS — Z0000 Encounter for general adult medical examination without abnormal findings: Secondary | ICD-10-CM | POA: Diagnosis not present

## 2022-03-18 DIAGNOSIS — M79671 Pain in right foot: Secondary | ICD-10-CM | POA: Diagnosis not present

## 2022-03-18 DIAGNOSIS — Z1211 Encounter for screening for malignant neoplasm of colon: Secondary | ICD-10-CM | POA: Diagnosis not present

## 2022-03-18 DIAGNOSIS — R945 Abnormal results of liver function studies: Secondary | ICD-10-CM | POA: Diagnosis not present

## 2022-03-18 DIAGNOSIS — R7989 Other specified abnormal findings of blood chemistry: Secondary | ICD-10-CM | POA: Diagnosis not present

## 2022-03-18 DIAGNOSIS — I10 Essential (primary) hypertension: Secondary | ICD-10-CM | POA: Diagnosis not present

## 2022-04-06 DIAGNOSIS — R2241 Localized swelling, mass and lump, right lower limb: Secondary | ICD-10-CM | POA: Diagnosis not present

## 2022-04-06 DIAGNOSIS — M79671 Pain in right foot: Secondary | ICD-10-CM | POA: Diagnosis not present

## 2022-04-08 DIAGNOSIS — I10 Essential (primary) hypertension: Secondary | ICD-10-CM | POA: Diagnosis not present

## 2022-04-13 DIAGNOSIS — M79671 Pain in right foot: Secondary | ICD-10-CM | POA: Diagnosis not present

## 2022-04-20 DIAGNOSIS — R2241 Localized swelling, mass and lump, right lower limb: Secondary | ICD-10-CM | POA: Diagnosis not present

## 2022-04-20 DIAGNOSIS — M722 Plantar fascial fibromatosis: Secondary | ICD-10-CM | POA: Diagnosis not present

## 2022-09-09 DIAGNOSIS — Z08 Encounter for follow-up examination after completed treatment for malignant neoplasm: Secondary | ICD-10-CM | POA: Diagnosis not present

## 2022-09-09 DIAGNOSIS — Z85828 Personal history of other malignant neoplasm of skin: Secondary | ICD-10-CM | POA: Diagnosis not present

## 2022-09-09 DIAGNOSIS — D485 Neoplasm of uncertain behavior of skin: Secondary | ICD-10-CM | POA: Diagnosis not present

## 2022-10-31 DIAGNOSIS — I10 Essential (primary) hypertension: Secondary | ICD-10-CM | POA: Diagnosis not present

## 2022-10-31 DIAGNOSIS — L282 Other prurigo: Secondary | ICD-10-CM | POA: Diagnosis not present

## 2022-11-07 DIAGNOSIS — L259 Unspecified contact dermatitis, unspecified cause: Secondary | ICD-10-CM | POA: Diagnosis not present

## 2022-11-15 DIAGNOSIS — L299 Pruritus, unspecified: Secondary | ICD-10-CM | POA: Diagnosis not present

## 2022-11-15 DIAGNOSIS — I10 Essential (primary) hypertension: Secondary | ICD-10-CM | POA: Diagnosis not present

## 2022-12-13 DIAGNOSIS — Z1211 Encounter for screening for malignant neoplasm of colon: Secondary | ICD-10-CM | POA: Diagnosis not present

## 2022-12-13 DIAGNOSIS — K5730 Diverticulosis of large intestine without perforation or abscess without bleeding: Secondary | ICD-10-CM | POA: Diagnosis not present

## 2023-02-07 DIAGNOSIS — D485 Neoplasm of uncertain behavior of skin: Secondary | ICD-10-CM | POA: Diagnosis not present

## 2023-02-07 DIAGNOSIS — L2989 Other pruritus: Secondary | ICD-10-CM | POA: Diagnosis not present

## 2023-02-07 DIAGNOSIS — L538 Other specified erythematous conditions: Secondary | ICD-10-CM | POA: Diagnosis not present

## 2023-02-07 DIAGNOSIS — C44319 Basal cell carcinoma of skin of other parts of face: Secondary | ICD-10-CM | POA: Diagnosis not present

## 2023-02-07 DIAGNOSIS — L814 Other melanin hyperpigmentation: Secondary | ICD-10-CM | POA: Diagnosis not present

## 2023-02-07 DIAGNOSIS — L570 Actinic keratosis: Secondary | ICD-10-CM | POA: Diagnosis not present

## 2023-02-07 DIAGNOSIS — L820 Inflamed seborrheic keratosis: Secondary | ICD-10-CM | POA: Diagnosis not present

## 2023-02-07 DIAGNOSIS — L821 Other seborrheic keratosis: Secondary | ICD-10-CM | POA: Diagnosis not present

## 2023-03-15 DIAGNOSIS — C44319 Basal cell carcinoma of skin of other parts of face: Secondary | ICD-10-CM | POA: Diagnosis not present

## 2023-03-21 DIAGNOSIS — Z1159 Encounter for screening for other viral diseases: Secondary | ICD-10-CM | POA: Diagnosis not present

## 2023-03-21 DIAGNOSIS — Z0000 Encounter for general adult medical examination without abnormal findings: Secondary | ICD-10-CM | POA: Diagnosis not present

## 2023-03-21 DIAGNOSIS — K760 Fatty (change of) liver, not elsewhere classified: Secondary | ICD-10-CM | POA: Diagnosis not present

## 2023-03-21 DIAGNOSIS — I10 Essential (primary) hypertension: Secondary | ICD-10-CM | POA: Diagnosis not present

## 2023-03-21 DIAGNOSIS — N6322 Unspecified lump in the left breast, upper inner quadrant: Secondary | ICD-10-CM | POA: Diagnosis not present

## 2023-03-21 DIAGNOSIS — E782 Mixed hyperlipidemia: Secondary | ICD-10-CM | POA: Diagnosis not present

## 2023-03-22 ENCOUNTER — Other Ambulatory Visit: Payer: Self-pay | Admitting: Family Medicine

## 2023-03-22 DIAGNOSIS — N6322 Unspecified lump in the left breast, upper inner quadrant: Secondary | ICD-10-CM

## 2023-03-23 DIAGNOSIS — E782 Mixed hyperlipidemia: Secondary | ICD-10-CM | POA: Diagnosis not present

## 2023-03-23 DIAGNOSIS — Z1159 Encounter for screening for other viral diseases: Secondary | ICD-10-CM | POA: Diagnosis not present

## 2023-03-23 DIAGNOSIS — I10 Essential (primary) hypertension: Secondary | ICD-10-CM | POA: Diagnosis not present

## 2023-03-30 ENCOUNTER — Other Ambulatory Visit (HOSPITAL_COMMUNITY): Payer: Self-pay | Admitting: Family Medicine

## 2023-03-30 DIAGNOSIS — E782 Mixed hyperlipidemia: Secondary | ICD-10-CM

## 2023-04-21 ENCOUNTER — Ambulatory Visit
Admission: RE | Admit: 2023-04-21 | Discharge: 2023-04-21 | Disposition: A | Discharge status: Home / Self Care | Payer: PPO | Source: Ambulatory Visit | Attending: Family Medicine | Admitting: Family Medicine

## 2023-04-21 DIAGNOSIS — N6002 Solitary cyst of left breast: Secondary | ICD-10-CM | POA: Diagnosis not present

## 2023-04-21 DIAGNOSIS — N6322 Unspecified lump in the left breast, upper inner quadrant: Secondary | ICD-10-CM

## 2023-04-21 DIAGNOSIS — N6459 Other signs and symptoms in breast: Secondary | ICD-10-CM | POA: Diagnosis not present

## 2023-04-28 ENCOUNTER — Encounter (HOSPITAL_COMMUNITY): Payer: Self-pay

## 2023-04-28 ENCOUNTER — Inpatient Hospital Stay (HOSPITAL_COMMUNITY): Admission: RE | Admit: 2023-04-28 | Source: Ambulatory Visit

## 2023-05-19 DIAGNOSIS — Z85828 Personal history of other malignant neoplasm of skin: Secondary | ICD-10-CM | POA: Diagnosis not present

## 2023-05-19 DIAGNOSIS — L820 Inflamed seborrheic keratosis: Secondary | ICD-10-CM | POA: Diagnosis not present

## 2023-05-19 DIAGNOSIS — L538 Other specified erythematous conditions: Secondary | ICD-10-CM | POA: Diagnosis not present

## 2023-05-19 DIAGNOSIS — Z09 Encounter for follow-up examination after completed treatment for conditions other than malignant neoplasm: Secondary | ICD-10-CM | POA: Diagnosis not present

## 2023-05-19 DIAGNOSIS — L570 Actinic keratosis: Secondary | ICD-10-CM | POA: Diagnosis not present

## 2023-05-19 DIAGNOSIS — Z08 Encounter for follow-up examination after completed treatment for malignant neoplasm: Secondary | ICD-10-CM | POA: Diagnosis not present

## 2023-07-24 DIAGNOSIS — I10 Essential (primary) hypertension: Secondary | ICD-10-CM | POA: Diagnosis not present

## 2023-07-24 DIAGNOSIS — E782 Mixed hyperlipidemia: Secondary | ICD-10-CM | POA: Diagnosis not present

## 2023-08-24 DIAGNOSIS — E782 Mixed hyperlipidemia: Secondary | ICD-10-CM | POA: Diagnosis not present

## 2023-08-24 DIAGNOSIS — I10 Essential (primary) hypertension: Secondary | ICD-10-CM | POA: Diagnosis not present

## 2023-09-13 DIAGNOSIS — L659 Nonscarring hair loss, unspecified: Secondary | ICD-10-CM | POA: Diagnosis not present

## 2023-09-24 DIAGNOSIS — I10 Essential (primary) hypertension: Secondary | ICD-10-CM | POA: Diagnosis not present

## 2023-09-24 DIAGNOSIS — E782 Mixed hyperlipidemia: Secondary | ICD-10-CM | POA: Diagnosis not present

## 2023-10-24 DIAGNOSIS — I10 Essential (primary) hypertension: Secondary | ICD-10-CM | POA: Diagnosis not present

## 2023-10-24 DIAGNOSIS — E782 Mixed hyperlipidemia: Secondary | ICD-10-CM | POA: Diagnosis not present
